# Patient Record
Sex: Male | Born: 2005 | Race: White | Hispanic: No | Marital: Single | Smoking: Never smoker
Health system: Southern US, Community
[De-identification: ages and names within clinical notes are randomized; demographics above are authoritative.]

---
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
## 2024-01-27 ENCOUNTER — Other Ambulatory Visit: Payer: Self-pay | Admitting: Internal Medicine

## 2024-01-27 MED ORDER — AMPHETAMINE-DEXTROAMPHETAMINE 5 MG PO TABS: 5.0000 mg | ORAL_TABLET | Freq: Every day | ORAL | 0 refills | Status: AC | PRN

## 2024-01-27 MED ORDER — AMPHETAMINE-DEXTROAMPHET ER 25 MG PO CP24: 25.0000 mg | ORAL_CAPSULE | ORAL | 0 refills | Status: AC
